# Patient Record
Sex: Male | Born: 2000 | Hispanic: No | Marital: Single | Smoking: Never smoker
Health system: Southern US, Community
[De-identification: ages and names within clinical notes are randomized; demographics above are authoritative.]

## PROBLEM LIST (undated history)

## (undated) DIAGNOSIS — R519 Headache, unspecified: Secondary | ICD-10-CM

## (undated) DIAGNOSIS — G259 Extrapyramidal and movement disorder, unspecified: Secondary | ICD-10-CM

## (undated) DIAGNOSIS — R51 Headache: Secondary | ICD-10-CM

## (undated) HISTORY — DX: Headache: R51

## (undated) HISTORY — DX: Headache, unspecified: R51.9

## (undated) HISTORY — DX: Extrapyramidal and movement disorder, unspecified: G25.9

## (undated) HISTORY — PX: CIRCUMCISION: SHX1350

---
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
## 2018-06-10 ENCOUNTER — Other Ambulatory Visit (INDEPENDENT_AMBULATORY_CARE_PROVIDER_SITE_OTHER): Payer: Self-pay | Admitting: Pediatrics

## 2018-06-10 DIAGNOSIS — G2569 Other tics of organic origin: Secondary | ICD-10-CM

## 2018-07-05 IMAGING — DX DG ANKLE COMPLETE 3+V*R*
3 series · 3 of 3 positions shown · non-contrast
Comparison: None in PACs

CLINICAL DATA: Inversion injury of the right ankle while playing
basketball yesterday. Persistent lateral pain and swelling and
inability to bear weight.

EXAM:
RIGHT ANKLE - COMPLETE 3+ VIEW

[ankle ap]
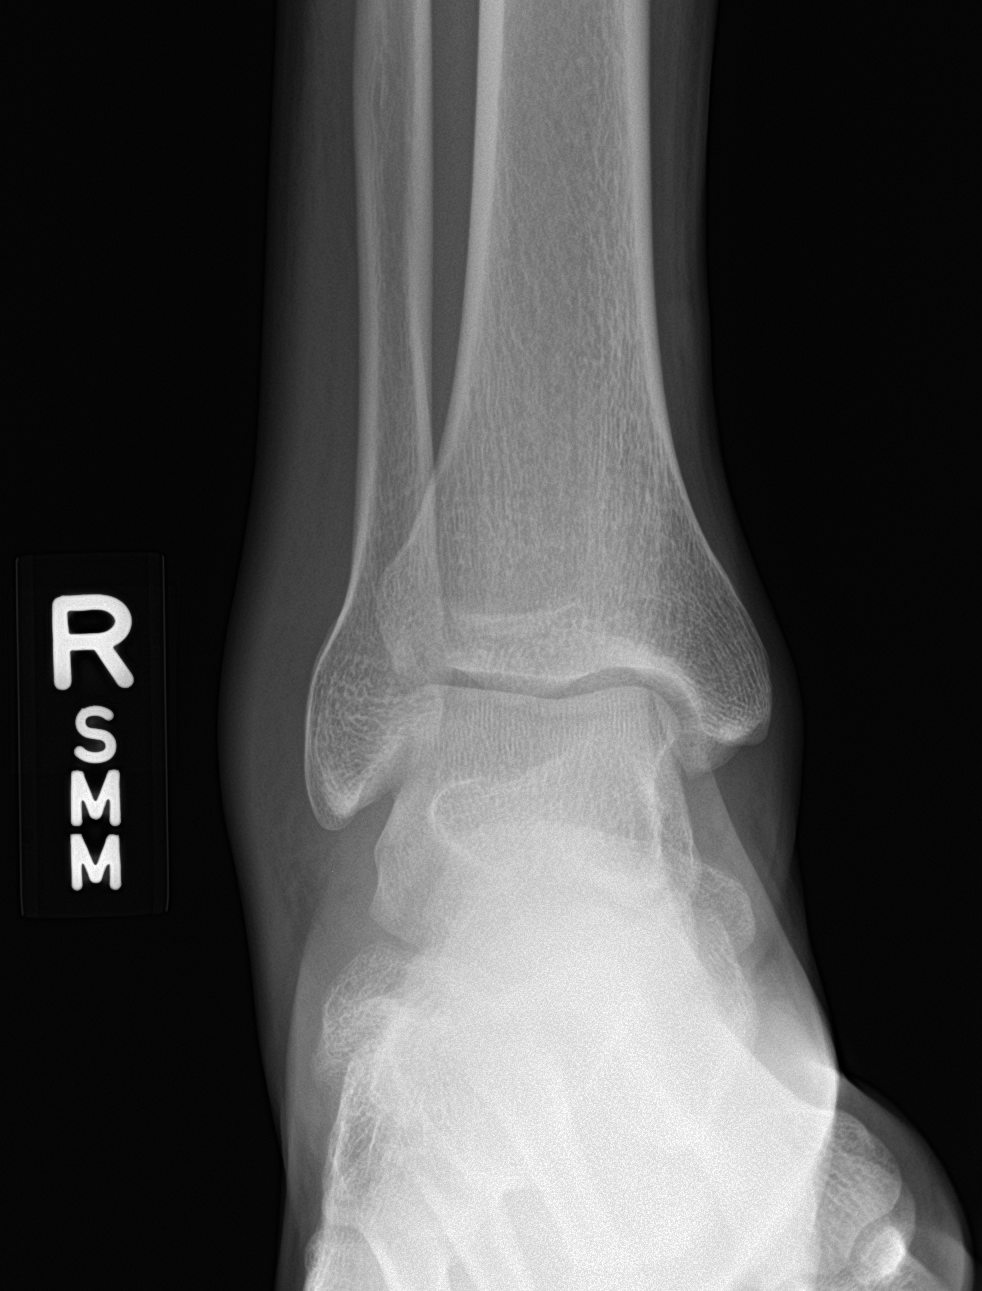

[ankle obl]
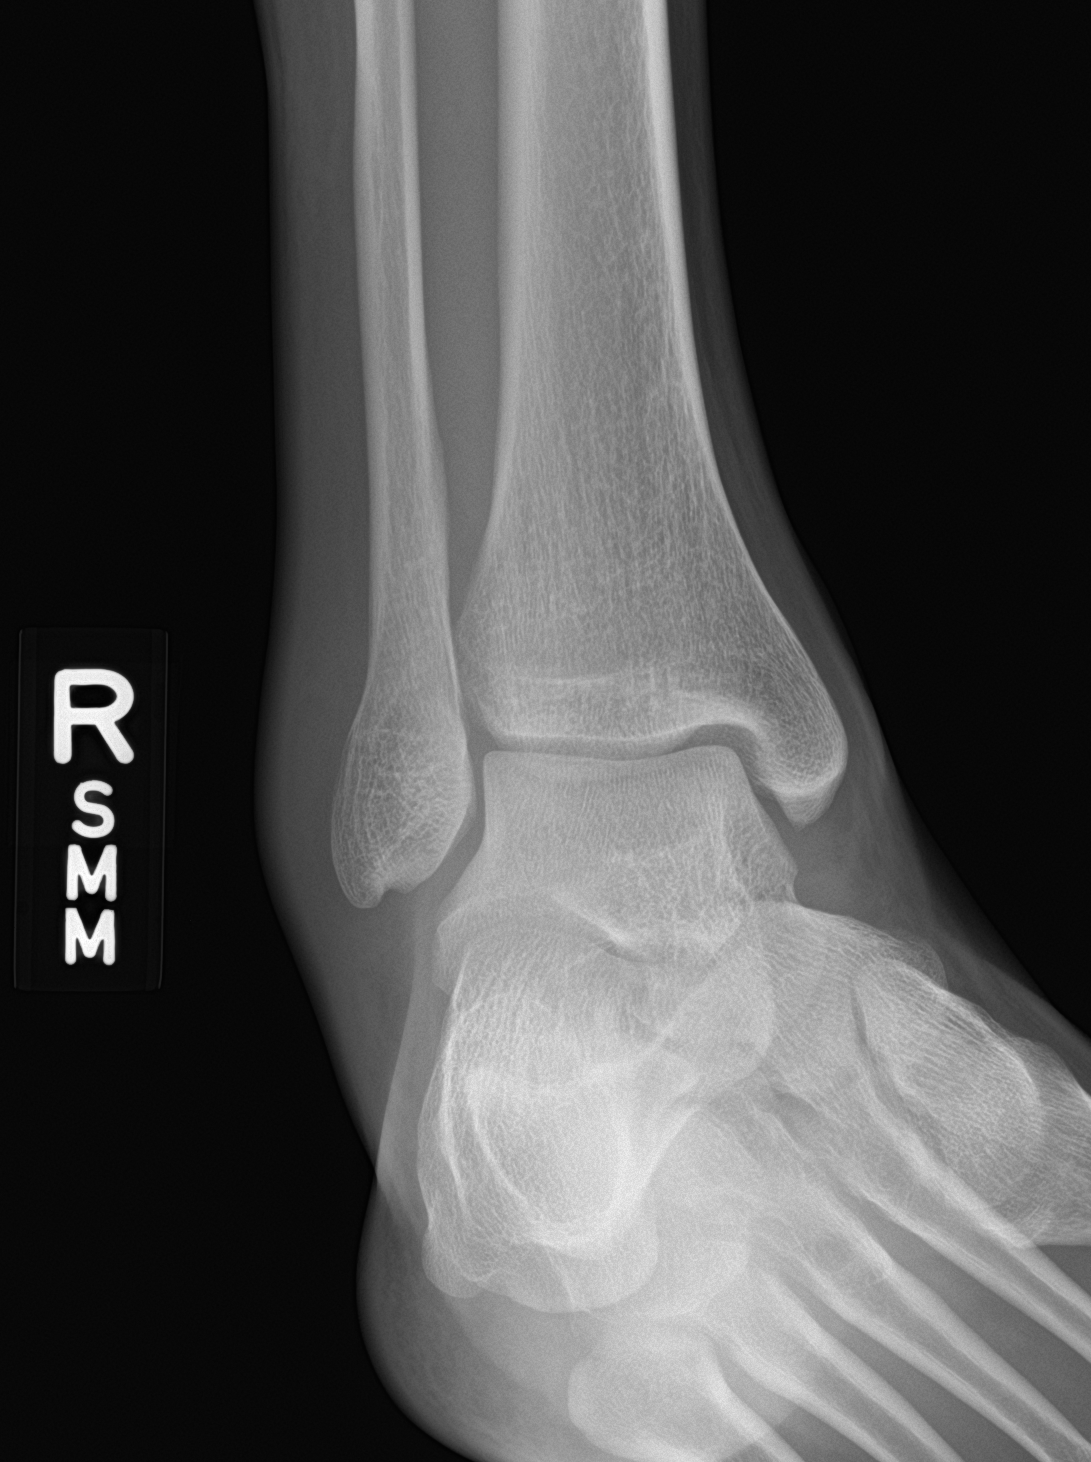

[ankle lat]
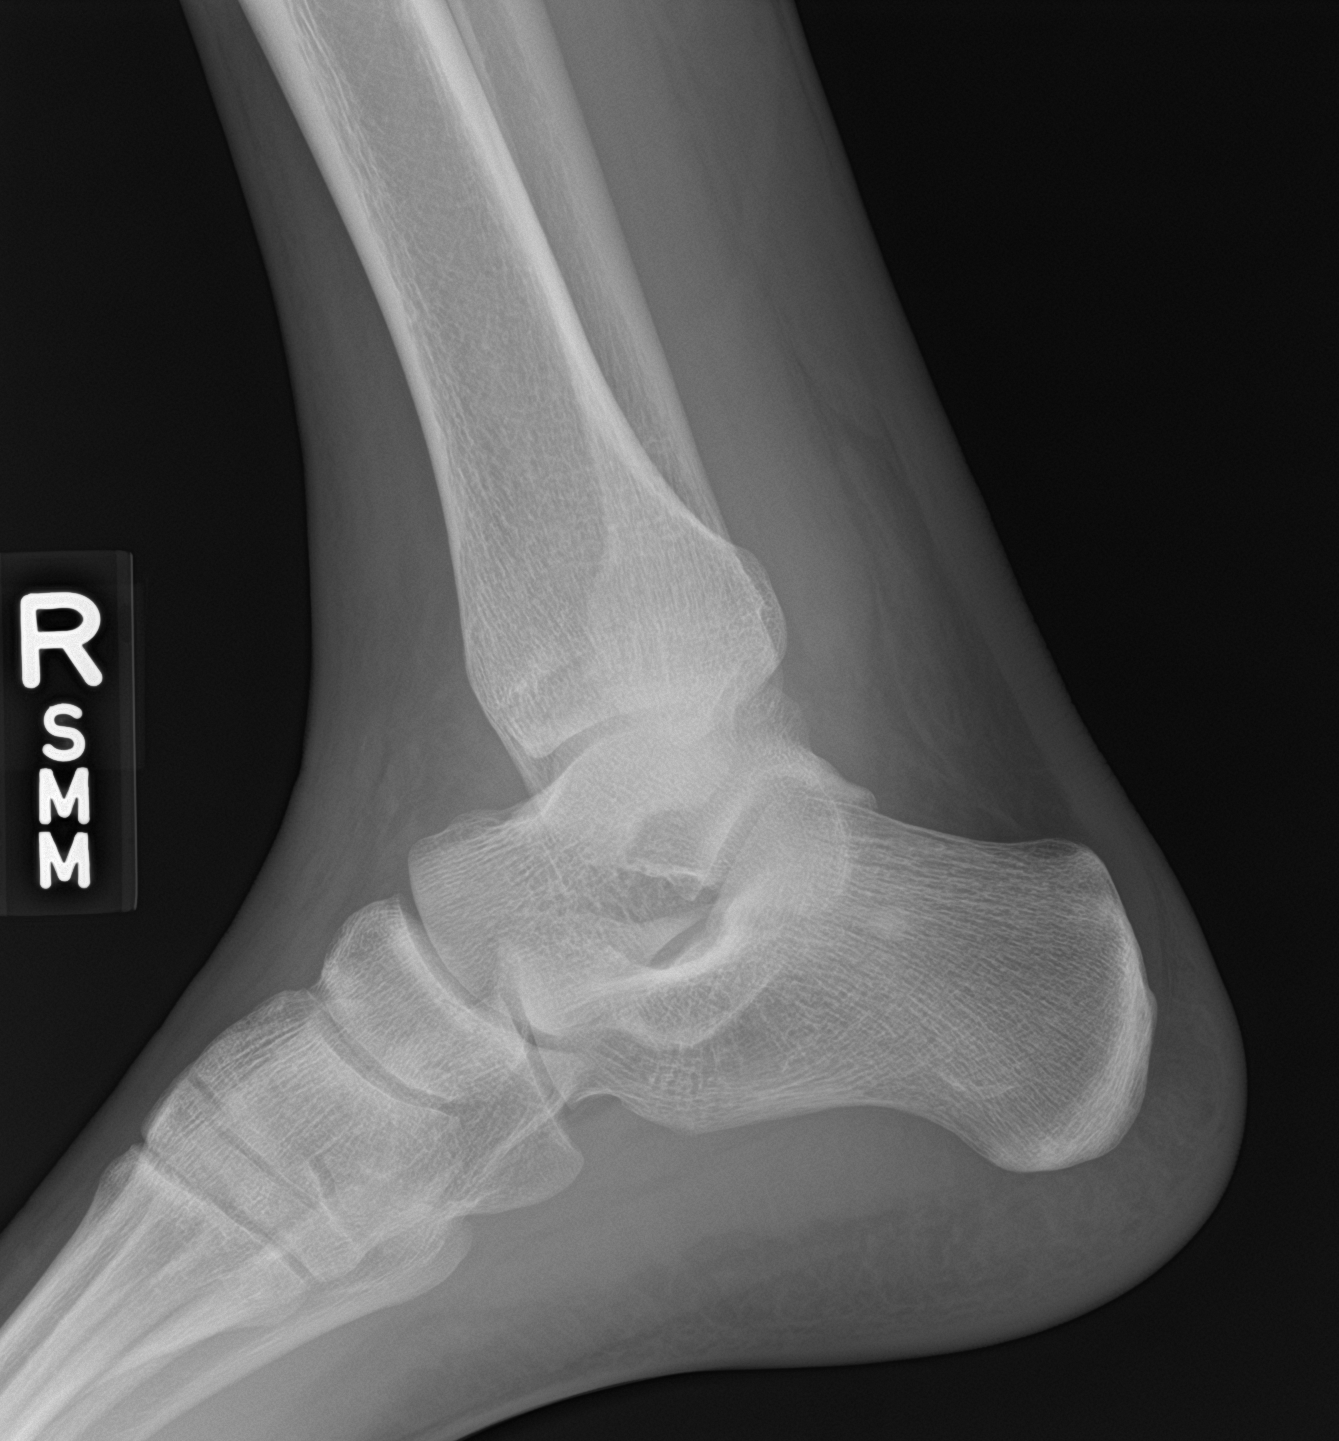

[3 of 3 positions shown; findings below may reference images not displayed]

FINDINGS: The bones are subjectively adequately mineralized. There is no acute
fracture nor dislocation. The ankle joint mortise is preserved. The
talar dome is intact. There is soft tissue swelling anteriorly and
laterally. The metatarsal bases are intact where visualized.
IMPRESSION: Soft tissue swelling anteriorly and laterally. No bony abnormality
is observed.

## 2018-07-11 ENCOUNTER — Other Ambulatory Visit (INDEPENDENT_AMBULATORY_CARE_PROVIDER_SITE_OTHER): Payer: Self-pay | Admitting: Pediatrics

## 2018-07-11 DIAGNOSIS — G2569 Other tics of organic origin: Secondary | ICD-10-CM

## 2018-08-16 ENCOUNTER — Other Ambulatory Visit (INDEPENDENT_AMBULATORY_CARE_PROVIDER_SITE_OTHER): Payer: Self-pay | Admitting: Pediatrics

## 2018-08-16 DIAGNOSIS — G2569 Other tics of organic origin: Secondary | ICD-10-CM

## 2018-09-12 ENCOUNTER — Other Ambulatory Visit (INDEPENDENT_AMBULATORY_CARE_PROVIDER_SITE_OTHER): Payer: Self-pay | Admitting: Pediatrics

## 2018-09-12 DIAGNOSIS — G2569 Other tics of organic origin: Secondary | ICD-10-CM

## 2018-09-13 NOTE — Telephone Encounter (Signed)
Rx sent to the pharmacy for 1 month. Attempted to contact patient was unable leave message. Attempted to contact mother to schedule an appointment and left a voicemail.

## 2018-09-13 NOTE — Telephone Encounter (Signed)
I understand the financial burden on the family, but we need to see [name] at least once a year.  You can have Arline AspCindy describe and set up a payment plan for the family.  This can be refilled again in 1 month, but after that if he does not come for a visit, we will not be able to continue to prescribe medication without evaluation.

## 2018-09-13 NOTE — Telephone Encounter (Signed)
Called mother and spoke with mother. She was interested in the payment plan. I set up the patient follow up appointment for October 16th.

## 2018-09-13 NOTE — Telephone Encounter (Addendum)
Mother called me back. I let her know I filled the patients medication for one month and that we would need to schedule a follow up visit. She stated he currently does not have insurance at this time. I let her know I would let the provider know this.  Please advise whether to refill next month or not for Guanfacine and Pimozide.

## 2018-10-05 ENCOUNTER — Ambulatory Visit (INDEPENDENT_AMBULATORY_CARE_PROVIDER_SITE_OTHER): Payer: Self-pay | Admitting: Pediatrics

## 2018-10-05 ENCOUNTER — Encounter (INDEPENDENT_AMBULATORY_CARE_PROVIDER_SITE_OTHER): Payer: Self-pay | Admitting: Pediatrics

## 2018-10-05 VITALS — BP 120/76 | HR 60 | Ht 73.0 in | Wt 146.2 lb

## 2018-10-05 DIAGNOSIS — G2569 Other tics of organic origin: Secondary | ICD-10-CM

## 2018-10-05 DIAGNOSIS — F900 Attention-deficit hyperactivity disorder, predominantly inattentive type: Secondary | ICD-10-CM

## 2018-10-05 DIAGNOSIS — G44219 Episodic tension-type headache, not intractable: Secondary | ICD-10-CM

## 2018-10-05 MED ORDER — PIMOZIDE 1 MG PO TABS: ORAL_TABLET | ORAL | 5 refills | Status: AC

## 2018-10-05 MED ORDER — GUANFACINE HCL ER 1 MG PO TB24: 1.0000 mg | ORAL_TABLET | Freq: Two times a day (BID) | ORAL | 5 refills | Status: AC

## 2018-10-05 NOTE — Patient Instructions (Signed)
The guanfacine extended release is expensive because 6 is extended release even though it is generic.  If we give you immediate release, I do not think that it will work as well to help your attention span.  If we use any of the neuro stimulant medications which are better for attention span, they could make your tics worse.  I am given you a Good Rx card.  Go to the website and sign off and see if this makes a difference.  $102 a month is a lot of money.

## 2018-10-05 NOTE — Progress Notes (Signed)
Patient: Ronald Sims MRN: [id number] Sex: male DOB: 04/20/2001  Provider: Ellison Carwin, MD Location of Care: [facility]  Note type: Routine return visit  History of Present Illness: Referral Source: Nils Pyle, MD History from: patient and [facility] chart Chief Complaint: Tics/Migraines  Ronald Sims is a 17 y.o. male who returns on October 05, 2018 for the first time since October 29, 2017.  The patient has history of migraine without aura that has subsided since he graduated from high school.  He has sporadic headaches, but they were all tension headaches.  There were no migraines.  He is back taking courses at [employer] in business.  He has not missed any school as a result of his headaches.  When he has headaches, he takes [employer].  He estimates that is only about once a week.  He has had some verbal tics, but does not find it too hard to control his tics.  He tends to have problems with anxiety and anxiety brings on his tics.  He is treated with a long-acting guanfacine and pimozide.  The guanfacine has become extremely expensive and mother asked if there was some alternative.  The patient's health is good.  He is sleeping well.  I think that he is enjoying [employer].  He had no other concerns today.  Review of Systems: A complete review of systems was assessed and was negative.  Past Medical History Diagnosis Date  . Headache   . Movement disorder    Hospitalizations: No., Head Injury: No., Nervous System Infections: No., Immunizations up to date: Yes.    Onset of vocal and motor tics in January, 2008.  Onset of migraine headaches in May 2009.  Treatment of his conditions with nadolol and clonidine. He was sleeping on clonidine and switched to guanfacine.  Detailed past medical history in January 05, 2011 Centricity note.  Birth History 8 pound infant born at full-term to a 35 year old gravida 3 para 85 male.  Gestation was  uncomplicated.  Labor was induced, mother received epidural anesthesia.  Normal spontaneous vaginal delivery.  Nursery course was uneventful.  Breast feeding took place over 4 months. Growth and development was recalled as normal  Behavior History none  Surgical History Procedure Laterality Date  . CIRCUMCISION     Family History family history includes Stroke in his paternal grandfather. Family history is negative for migraines, seizures, intellectual disabilities, blindness, deafness, birth defects, chromosomal disorder, or autism.  Social History Social Needs  . Financial resource strain: Not on file  . Food insecurity:    Worry: Not on file    Inability: Not on file  . Transportation needs:    Medical: Not on file    Non-medical: Not on file  Tobacco Use  . Smoking status: Never Smoker  . Smokeless tobacco: Never Used  Substance and Sexual Activity  . Alcohol use: No    Alcohol/week: 0.0 standard drinks  . Drug use: No  . 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    He lives with his parents, siblings, and niece.     He enjoys basketball, football, and four wheeling.   No Known Allergies  Physical Exam BP 120/76   Pulse 60   Ht 6\' 1"  (1.854 m)   Wt 146 lb 3.2 oz (66.3 kg)  BMI 19.29 kg/m   General: alert, well developed, well nourished, in no acute distress, brown hair, brown eyes, right handed Head: normocephalic, no dysmorphic features Ears, Nose and Throat: Otoscopic: tympanic membranes normal; pharynx: oropharynx is pink without exudates or tonsillar hypertrophy Neck: supple, full range of motion, no cranial or cervical bruits Respiratory: auscultation clear Cardiovascular: no murmurs, pulses are normal Musculoskeletal: no skeletal deformities or apparent scoliosis Skin: no rashes or  neurocutaneous lesions  Neurologic Exam  Mental Status: alert; oriented to person, place and year; knowledge is normal for age; language is normal Cranial Nerves: visual fields are full to double simultaneous stimuli; extraocular movements are full and conjugate; pupils are round reactive to light; funduscopic examination shows sharp disc margins with normal vessels; symmetric facial strength; midline tongue and uvula; air conduction is greater than bone conduction bilaterally Motor: Normal strength, tone and mass; good fine motor movements; no pronator drift Sensory: intact responses to cold, vibration, proprioception and stereognosis Coordination: good finger-to-nose, rapid repetitive alternating movements and finger apposition Gait and Station: normal gait and station: patient is able to walk on heels, toes and tandem without difficulty; balance is adequate; Romberg exam is negative; Gower response is negative Reflexes: symmetric and diminished bilaterally; no clonus; bilateral flexor plantar responses  Assessment 1. Tics of organic origin, G25.69. 2. Episodic tension-type headache, not intractable, G44.219. 3. Attention deficit hyperactivity disorder, predominantly inattentive type, F90.0.  Discussion I am pleased that the patient's headaches are tension-type in nature and infrequent.  I am not certain what to do about his extended-release guanfacine.  I am not certain that short-acting guanfacine will do as well.  That may be his only option.  Plan I wrote a prescription for good Rx and suggested that the family go online and see if they could obtain any state savings.  I offered to switch to immediate release guanfacine, but I do not think that it will work as well as the extended-release guanfacine to help focus his attention.  No change will be made in the pimozide.  I am afraid if that was taken away, then we might very well see resurgence of his tics.  He will return to see me in a  year.  Greater than 50% of the 25 minute visit was spent in counseling and coordination of care concerning his tics, his attention span, and alternatives to extended release guanfacine.   Medication List    Accurate as of 10/05/18 10:13 AM.      guanFACINE 1 MG Tb24 ER tablet Commonly known as:  INTUNIV TAKE ONE TABLET BY MOUTH TWICE DAILY   Pimozide 1 MG Tabs TAKE ONE TABLET IN THE MORNING AND TAKE ONE TABLET WITH DINNER    The medication list was reviewed and reconciled. All changes or newly prescribed medications were explained.  A complete medication list was provided to the patient/caregiver.  Deetta Perla MD

## 2021-04-24 ENCOUNTER — Encounter (INDEPENDENT_AMBULATORY_CARE_PROVIDER_SITE_OTHER): Payer: Self-pay
